# Patient Record
Sex: Male | Born: 1975
Health system: Southern US, Community
[De-identification: ages and names within clinical notes are randomized; demographics above are authoritative.]

## PROBLEM LIST (undated history)

## (undated) DIAGNOSIS — J302 Other seasonal allergic rhinitis: Secondary | ICD-10-CM

## (undated) DIAGNOSIS — K760 Fatty (change of) liver, not elsewhere classified: Secondary | ICD-10-CM

## (undated) DIAGNOSIS — G4733 Obstructive sleep apnea (adult) (pediatric): Principal | ICD-10-CM

## (undated) HISTORY — DX: Other seasonal allergic rhinitis: J30.2

## (undated) HISTORY — DX: Obstructive sleep apnea (adult) (pediatric): G47.33

## (undated) HISTORY — PX: OTHER SURGICAL HISTORY: SHX169

## (undated) HISTORY — DX: Fatty (change of) liver, not elsewhere classified: K76.0

---

## 2008-07-20 ENCOUNTER — Encounter: Admission: RE | Admit: 2008-07-20 | Discharge: 2008-07-20 | Payer: Self-pay | Admitting: Family Medicine

## 2009-06-03 IMAGING — US US ABDOMEN COMPLETE
1 series · 13 of 25 positions shown · non-contrast
Comparison: None

CLINICAL DATA: Elevated liver function tests.

ABDOMEN ULTRASOUND
TECHNIQUE: Complete abdominal ultrasound examination was performed
including evaluation of the liver, gallbladder, bile ducts,
pancreas, kidneys, spleen, IVC, and abdominal aorta.

[Series 1: us abdomen complete · 0.32mm/px · 13 of 54 slices shown]
[im 1/54]
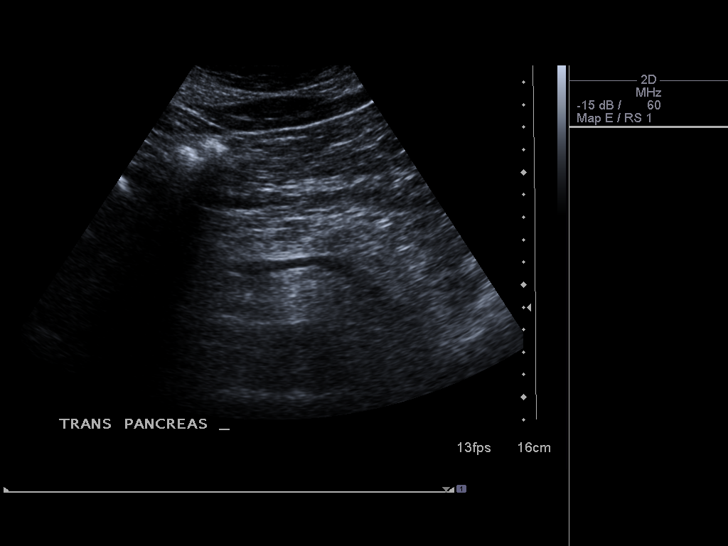
[im 5/54]
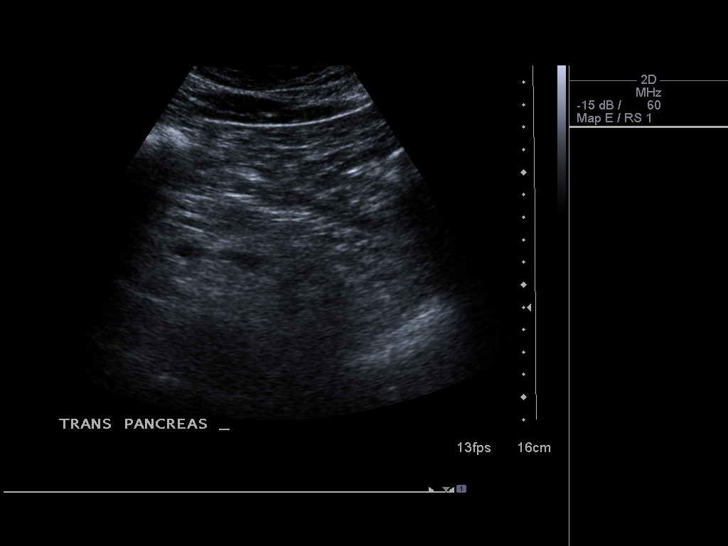
[im 9/54]
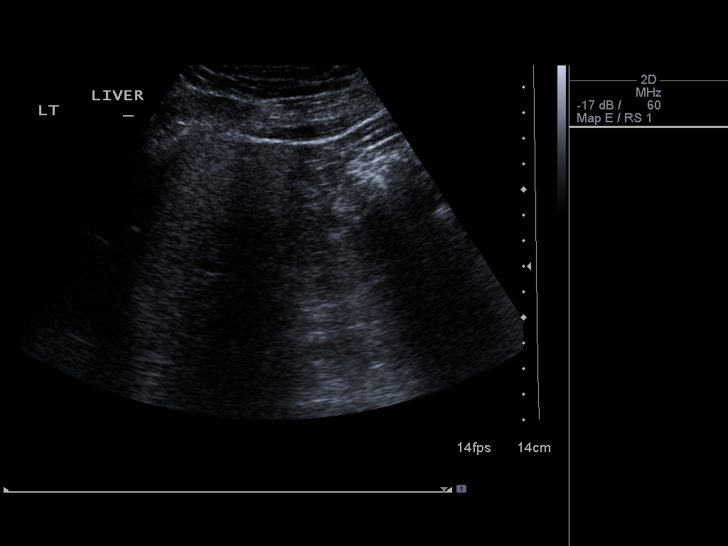
[im 14/54]
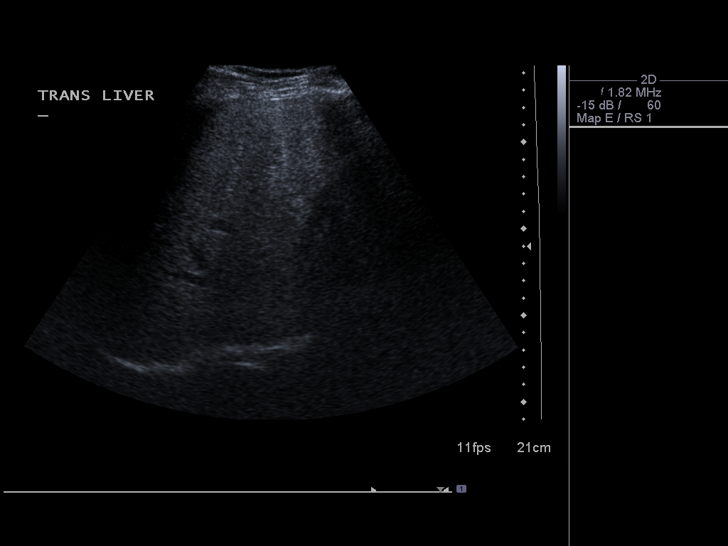
[im 18/54]
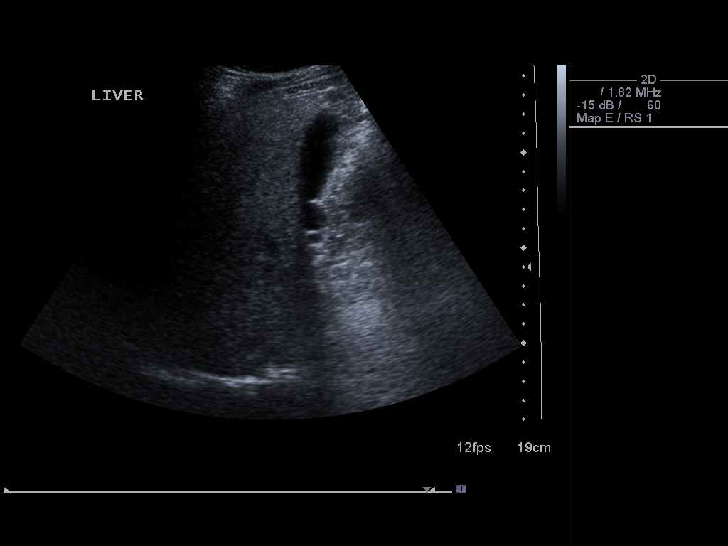
[im 23/54]
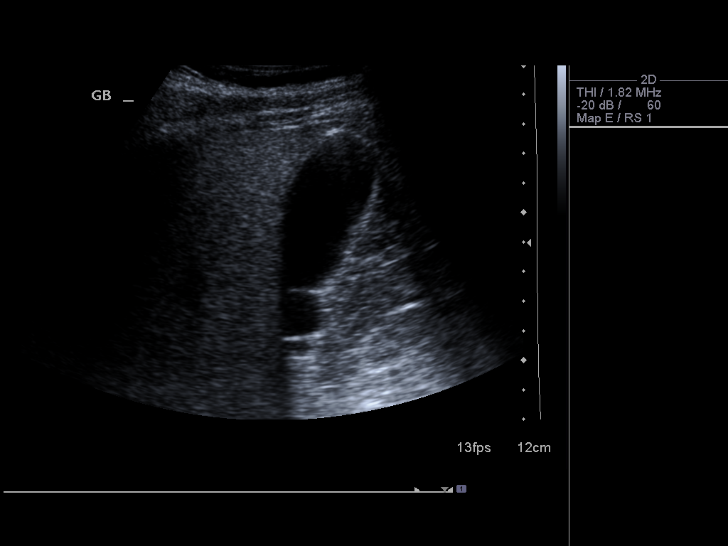
[im 27/54]
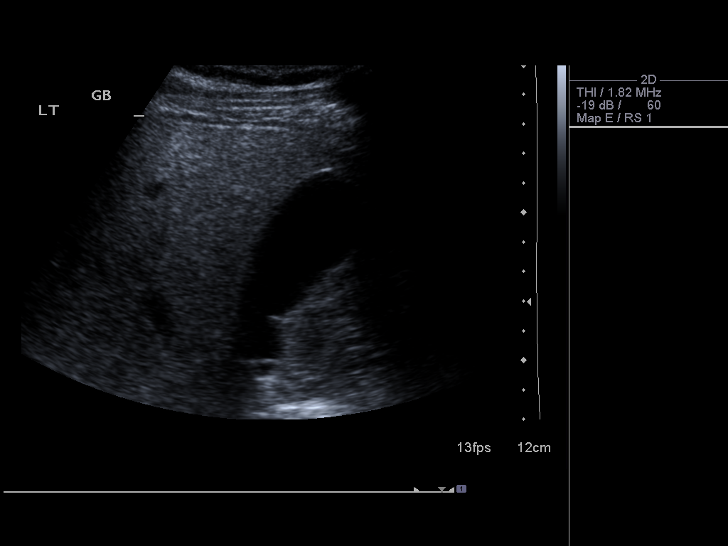
[im 31/54]
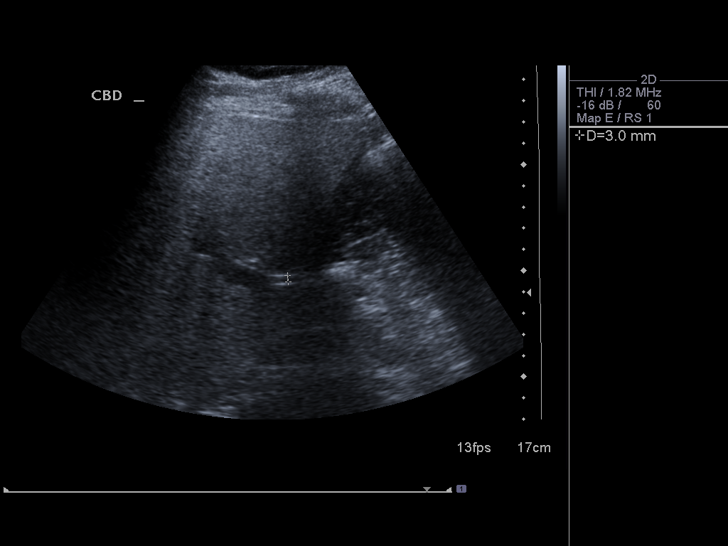
[im 36/54]
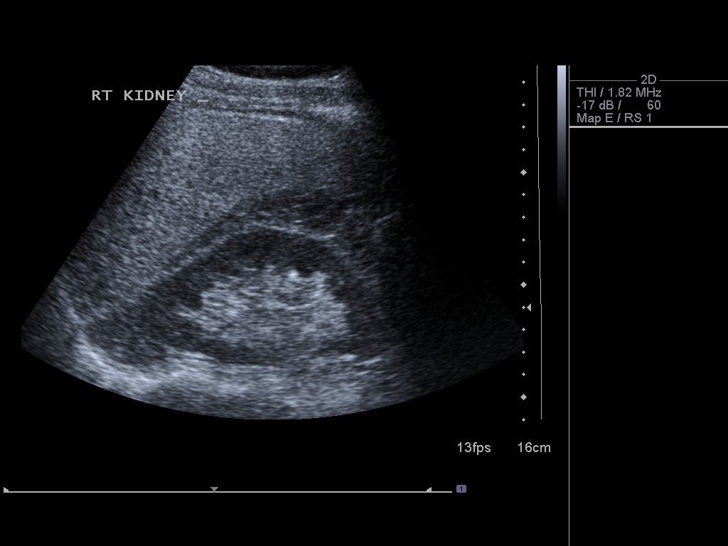
[im 40/54]
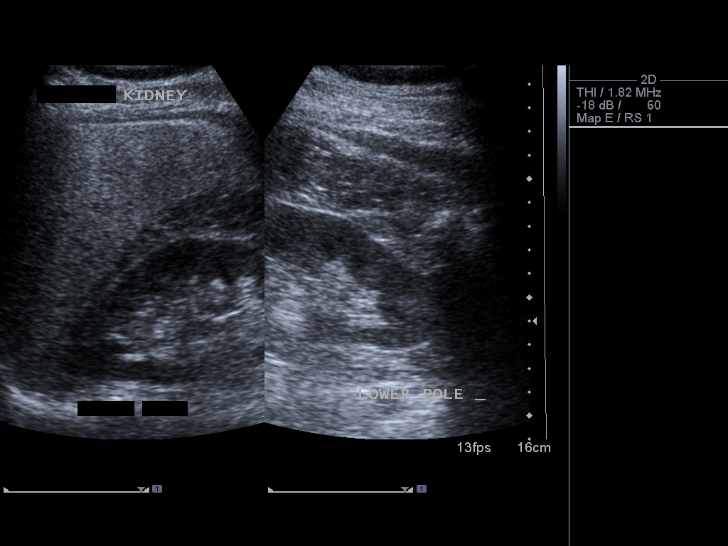
[im 45/54]
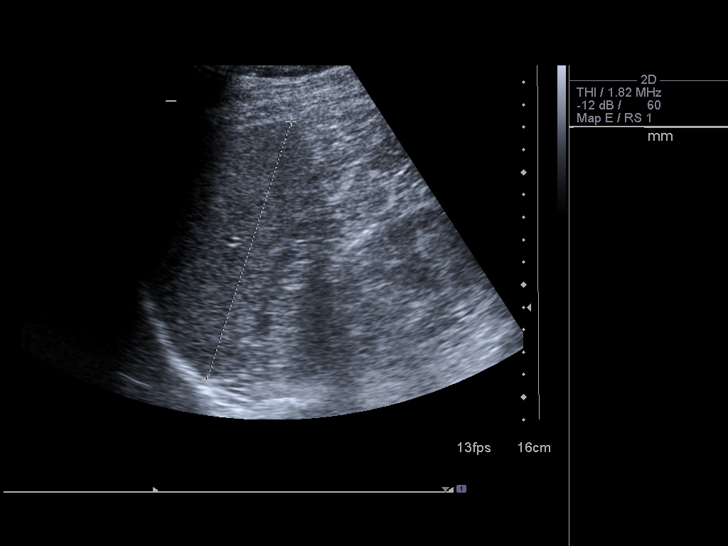
[im 49/54]
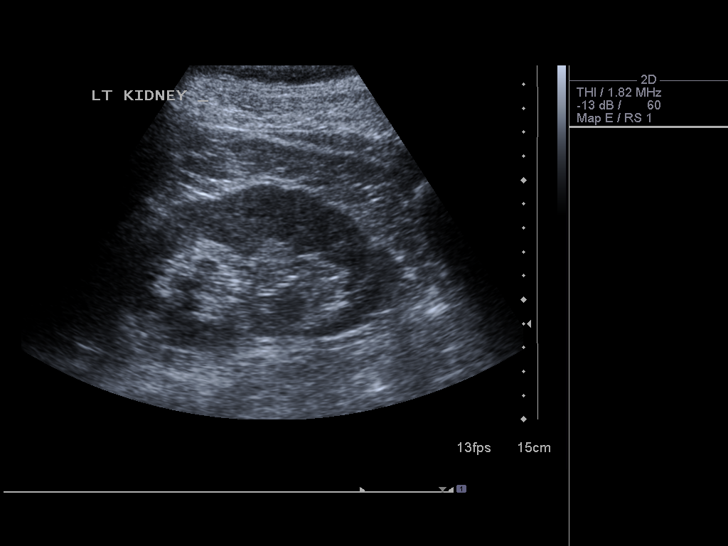
[im 54/54]
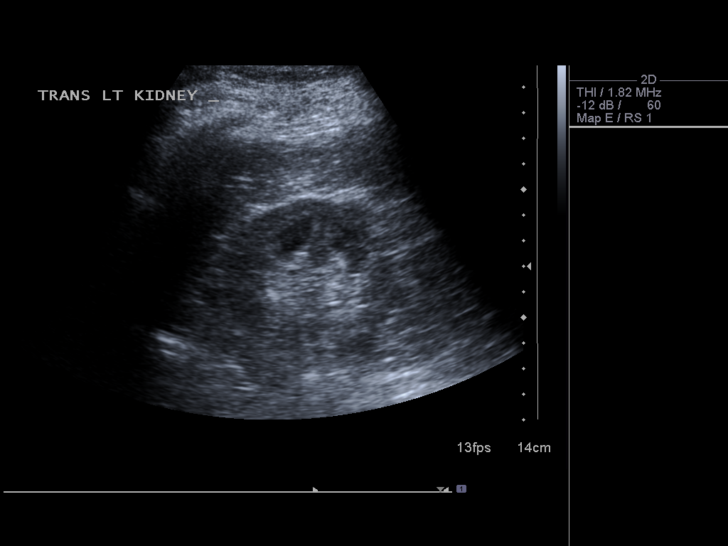

[13 of 25 positions shown; findings below may reference images not displayed]

FINDINGS: Gallbladder is sonographically normal without sludge or
gallstones with normal 1 mm wall thickness.  No dilated
intrahepatic or extrahepatic bile ducts are seen with common bile
duct measuring normally at 3 mm.  Liver demonstrates diffuse
increased echogenicity consistent with slight fatty change.  Liver
size is normal with no focal solid lesions.  Pancreas and inferior
vena cava are obscured due to the patient's body habitus and
overlying gastrointestinal gas.  Spleen (12.1 cm long), right
kidney (13 cm long), left kidney (12.5 cm long), and visualized
portion of abdominal aorta (proximal abdominal aorta obscured with
visualized maximum diameter at the mid segment measures 1.9 cm)
appear sonographically normal.  No free fluid noted.
IMPRESSION: 1.  Obscuration of proximal abdominal aorta, pancreas, and
infrahepatic inferior vena cava.
2.  Slight diffuse fatty infiltration liver.
3.  Otherwise, negative.

## 2015-07-03 ENCOUNTER — Telehealth: Payer: Self-pay | Admitting: Hematology

## 2015-07-03 NOTE — Telephone Encounter (Signed)
NEW PATIENT APPT-S/W PATIENT AND GAVE NP APPT FOR 10/11 @ 1:45 W/DR. KALE REFERRING DR. Modesto Charon DX-ELEVATED HGB   REFERRAL  INFORMATION SCANNED

## 2015-07-12 ENCOUNTER — Telehealth: Payer: Self-pay | Admitting: Hematology

## 2015-07-12 ENCOUNTER — Encounter: Payer: Self-pay | Admitting: Hematology

## 2015-07-12 ENCOUNTER — Ambulatory Visit (HOSPITAL_BASED_OUTPATIENT_CLINIC_OR_DEPARTMENT_OTHER): Payer: Commercial Managed Care - PPO | Admitting: Hematology

## 2015-07-12 ENCOUNTER — Other Ambulatory Visit (HOSPITAL_BASED_OUTPATIENT_CLINIC_OR_DEPARTMENT_OTHER): Payer: Commercial Managed Care - PPO

## 2015-07-12 VITALS — BP 126/78 | HR 60 | Temp 98.6°F | Resp 18 | Ht 74.0 in | Wt 278.2 lb

## 2015-07-12 DIAGNOSIS — K760 Fatty (change of) liver, not elsewhere classified: Secondary | ICD-10-CM | POA: Diagnosis not present

## 2015-07-12 DIAGNOSIS — J302 Other seasonal allergic rhinitis: Secondary | ICD-10-CM | POA: Diagnosis not present

## 2015-07-12 DIAGNOSIS — D582 Other hemoglobinopathies: Secondary | ICD-10-CM | POA: Diagnosis not present

## 2015-07-12 DIAGNOSIS — G4733 Obstructive sleep apnea (adult) (pediatric): Secondary | ICD-10-CM

## 2015-07-12 HISTORY — DX: Fatty (change of) liver, not elsewhere classified: K76.0

## 2015-07-12 HISTORY — DX: Obstructive sleep apnea (adult) (pediatric): G47.33

## 2015-07-12 LAB — COMPREHENSIVE METABOLIC PANEL (CC13)
ALBUMIN: 4.2 g/dL (ref 3.5–5.0)
ALK PHOS: 83 U/L (ref 40–150)
ALT: 67 U/L — ABNORMAL HIGH (ref 0–55)
AST: 40 U/L — ABNORMAL HIGH (ref 5–34)
Anion Gap: 6 mEq/L (ref 3–11)
BILIRUBIN TOTAL: 0.47 mg/dL (ref 0.20–1.20)
BUN: 12.3 mg/dL (ref 7.0–26.0)
CO2: 28 mEq/L (ref 22–29)
Calcium: 9.6 mg/dL (ref 8.4–10.4)
Chloride: 108 mEq/L (ref 98–109)
Creatinine: 1.1 mg/dL (ref 0.7–1.3)
EGFR: 85 mL/min/{1.73_m2} — AB (ref >90)
GLUCOSE: 91 mg/dL (ref 70–140)
Potassium: 4.3 mEq/L (ref 3.5–5.1)
SODIUM: 142 meq/L (ref 136–145)
TOTAL PROTEIN: 7.1 g/dL (ref 6.4–8.3)

## 2015-07-12 LAB — CBC & DIFF AND RETIC
BASO%: 0.6 % (ref 0.0–2.0)
Basophils Absolute: 0 10*3/uL (ref 0.0–0.1)
EOS%: 2.4 % (ref 0.0–7.0)
Eosinophils Absolute: 0.2 10*3/uL (ref 0.0–0.5)
HCT: 49.5 % (ref 38.4–49.9)
HGB: 17 g/dL (ref 13.0–17.1)
Immature Retic Fract: 2 % — ABNORMAL LOW (ref 3.00–10.60)
LYMPH%: 28.5 % (ref 14.0–49.0)
MCH: 29.3 pg (ref 27.2–33.4)
MCHC: 34.3 g/dL (ref 32.0–36.0)
MCV: 85.3 fL (ref 79.3–98.0)
MONO#: 0.5 10*3/uL (ref 0.1–0.9)
MONO%: 8.4 % (ref 0.0–14.0)
NEUT#: 3.8 10*3/uL (ref 1.5–6.5)
NEUT%: 60.1 % (ref 39.0–75.0)
Platelets: 167 10*3/uL (ref 140–400)
RBC: 5.8 10*6/uL (ref 4.20–5.82)
RDW: 12.7 % (ref 11.0–14.6)
Retic %: 1.25 % (ref 0.80–1.80)
Retic Ct Abs: 72.5 10*3/uL (ref 34.80–93.90)
WBC: 6.3 10*3/uL (ref 4.0–10.3)
lymph#: 1.8 10*3/uL (ref 0.9–3.3)

## 2015-07-12 NOTE — Telephone Encounter (Signed)
Gave adn printed avs °

## 2015-07-12 NOTE — Progress Notes (Signed)
Marland Kitchen    HEMATOLOGY/ONCOLOGY CONSULTATION NOTE  Date of Service: 07/12/2015  No care team member to display  CHIEF COMPLAINTS/PURPOSE OF CONSULTATION:  Elevated hemoglobin.   HISTORY OF PRESENTING ILLNESS:  Jeffrey Gonzalez is a wonderful 39 y.o. male who has been referred to [place] by Dr .Redmond Baseman, MD  for evaluation and management of elevated hemoglobin.  He has a history of obstructive sleep apnea currently using a CPAP machine, nonalcoholic fatty liver, obesity, history of seasonal allergies and history of early heart disease in his father at age 51yo. Patient has been a never smoker and denies using alcohol.  Patient had routine labs with his primary care physician Dr. Modesto Charon and was noted to have hemoglobin of 17.1 with a hematocrit of 51.4 on 06/20/2015. His WBC count was 6.6k and platelets of 181k. He has had similar hemoglobin and hematocrit on labs in June and April as well. He was referred for further evaluation of possible polycythemia.  Patient notes no history of blood clots or other venous thromboembolism. No family history of blood disorders.  No dizziness no lightheadedness no focal neurological deficits. No excessive new fatigue.  He was recently diagnosed with obstructive sleep apnea and has been using his CPAP regularly. He is working on weight loss through diet and exercise. Lifelong nonsmoker. No history of living at high altitudes.  MEDICAL HISTORY:  Past Medical History  Diagnosis Date  . Obstructive sleep apnea 07/12/2015  . Fatty liver disease, nonalcoholic 07/12/2015  . Seasonal allergies 07/13/2015    SURGICAL HISTORY: History reviewed. No pertinent past surgical history.  SOCIAL HISTORY: Social History   Social History  . Marital Status: Unknown    Spouse Name: N/A  . Number of Children: N/A  . Years of Education: N/A   Occupational History  . Not on file.   Social History Main Topics  . Smoking status: Never Smoker   . Smokeless tobacco:  Not on file  . Alcohol Use: No  . Drug Use: No  . Sexual Activity: No   Other Topics Concern  . Not on file   Social History Narrative    FAMILY HISTORY: Family History  Problem Relation Age of Onset  . Breast cancer Mother   . Coronary artery disease Father 80    ALLERGIES:  has no allergies on file.  MEDICATIONS:  No current outpatient prescriptions on file.   No current facility-administered medications for this visit.    REVIEW OF SYSTEMS:    10 Point review of Systems was done is negative except as noted above.  PHYSICAL EXAMINATION: ECOG PERFORMANCE STATUS: 1 - Symptomatic but completely ambulatory  . Filed Vitals:   07/12/15 1346  Height:  (1.88 m)  Weight: 278 lb 3.2 oz (126.191 kg)   Filed Weights   07/12/15 1346  Weight: 278 lb 3.2 oz (126.191 kg)   .Body mass index is 35.7 kg/(m^2).  GENERAL:alert, in no acute distress and comfortable SKIN: skin color, texture, turgor are normal, no rashes or significant lesions EYES: normal, conjunctiva are pink and non-injected, sclera clear OROPHARYNX:no exudate, no erythema and lips, buccal mucosa, and tongue normal  NECK: supple, no JVD, thyroid normal size, non-tender, without nodularity LYMPH:  no palpable lymphadenopathy in the cervical, axillary or inguinal LUNGS: clear to auscultation with normal respiratory effort HEART: regular rate & rhythm,  no murmurs and no lower extremity edema ABDOMEN: abdomen soft, obese non-tender, normoactive bowel sounds . No hepatosplenomegaly. Musculoskeletal: no cyanosis of digits and no clubbing  PSYCH: alert & oriented x 3 with fluent speech NEURO: no focal motor/sensory deficits  LABORATORY DATA:  I have reviewed the data as listed  . CBC Latest Ref Rng 07/12/2015  WBC 4.0 - 10.3 10e3/uL 6.3  Hemoglobin 13.0 - 17.1 g/dL 86.5  Hematocrit 78.4 - 49.9 % 49.5  Platelets 140 - 400 10e3/uL 167   . CBC    Component Value Date/Time   WBC 6.3 07/12/2015 1501    RBC 5.80 07/12/2015 1501   HGB 17.0 07/12/2015 1501   HCT 49.5 07/12/2015 1501   PLT 167 07/12/2015 1501   MCV 85.3 07/12/2015 1501   MCH 29.3 07/12/2015 1501   MCHC 34.3 07/12/2015 1501   RDW 12.7 07/12/2015 1501   LYMPHSABS 1.8 07/12/2015 1501   MONOABS 0.5 07/12/2015 1501   EOSABS 0.2 07/12/2015 1501   BASOSABS 0.0 07/12/2015 1501     . CMP Latest Ref Rng 07/12/2015  Glucose 70 - 140 mg/dl 91  BUN 7.0 - 69.6 mg/dL 29.5  Creatinine 0.7 - 1.3 mg/dL 1.1  Sodium 284 - 132 mEq/L 142  Potassium 3.5 - 5.1 mEq/L 4.3  CO2 22 - 29 mEq/L 28  Calcium 8.4 - 10.4 mg/dL 9.6  Total Protein 6.4 - 8.3 g/dL 7.1  Total Bilirubin 4.40 - 1.20 mg/dL 1.02  Alkaline Phos 40 - 150 U/L 83  AST 5 - 34 U/L 40(H)  ALT 0 - 55 U/L 67(H)     RADIOGRAPHIC STUDIES: I have personally reviewed the radiological images as listed and agreed with the findings in the report. No results found.  ASSESSMENT & PLAN:   39 year old patient male referred for  #1 Polycythemia. Patient's labs repeated here showed a hemoglobin of 17 and hematocrit of 49.5 which is at the very limits of the normal range. The absence of other cytosis (leucocytosis or thrombocytosis], absence of hepatosplenomegaly, increased iron levels with a ferritin of 239 and the patients younger age all make polycythemia vera less likely. His high normal  hemoglobin and hematocrit Is likely a secondary to his sleep apnea.  Plan  -Patient is somewhat anxious about this finding and will rule out completely the possibility of this being a bone marrow disorder.  -We shall get clonal markers Jak2 [id number] and Jak2 Exon 12 mutations to rule out polycythemia vera with 99% certainty.  -Optimization of his sleep apnea treatment, weight loss to achieve optimal body weight will likely be helpful in reducing his hemoglobin and hematocrit into the more normal territory. -No overt indication for aspirin use at this time . -No indication for therapeutic phlebotomy at  this time . -there aren't clear guidelines regarding ideal hematocrit targets in secondary polycythemia  which might be protective against VTE, MI/Stroke Continue follow-up with primary care physician to address her risk factors for atherosclerotic heart disease especially given his strong family history of early heart disease in his father.  #2 nonalcoholic fatty liver with slightly elevated transaminases -Likely related to obesity/metabolic syndrome. -Evaluation and management per primary care physician. -Counseled on the importance of appropriate diet and exercise.  I will call the patient with the results of his moderately genetic testing. It that's negative no further intervention from our side. Patient needs to work on weight loss and optimal management of sleep apnea.  We'll refer the patient back to his primary care physician.  I appreciate the opportunity to help take care of this patient.  All of the patients questions were answered to his  apparent satisfaction. The patient knows  to call the clinic with any problems, questions or concerns.  I spent 30 minutes counseling the patient face to face. The total time spent in the appointment was 45 minutes and more than 50% was on counseling and direct patient cares.    Wyvonnia Lora MD MS AAHIVMS [facility] Hematology/Oncology Physician [facility]  (Office):       [phone] (Work cell):  [phone] (Fax):           [phone]  07/12/2015 2:07 PM

## 2015-07-13 ENCOUNTER — Encounter: Payer: Self-pay | Admitting: Hematology

## 2015-07-13 DIAGNOSIS — J302 Other seasonal allergic rhinitis: Secondary | ICD-10-CM

## 2015-07-13 HISTORY — DX: Other seasonal allergic rhinitis: J30.2

## 2017-06-05 DIAGNOSIS — Z630 Problems in relationship with spouse or partner: Secondary | ICD-10-CM | POA: Diagnosis not present

## 2019-02-21 DIAGNOSIS — S46211A Strain of muscle, fascia and tendon of other parts of biceps, right arm, initial encounter: Secondary | ICD-10-CM | POA: Diagnosis not present

## 2019-02-21 DIAGNOSIS — S46209A Unspecified injury of muscle, fascia and tendon of other parts of biceps, unspecified arm, initial encounter: Secondary | ICD-10-CM | POA: Diagnosis not present

## 2019-05-30 ENCOUNTER — Emergency Department (HOSPITAL_BASED_OUTPATIENT_CLINIC_OR_DEPARTMENT_OTHER): Payer: Commercial Managed Care - PPO

## 2019-05-30 ENCOUNTER — Other Ambulatory Visit: Payer: Self-pay

## 2019-05-30 ENCOUNTER — Encounter (HOSPITAL_BASED_OUTPATIENT_CLINIC_OR_DEPARTMENT_OTHER): Payer: Self-pay | Admitting: Emergency Medicine

## 2019-05-30 ENCOUNTER — Emergency Department (HOSPITAL_BASED_OUTPATIENT_CLINIC_OR_DEPARTMENT_OTHER)
Admission: EM | Admit: 2019-05-30 | Discharge: 2019-05-30 | Disposition: A | Discharge status: Home / Self Care | Payer: Commercial Managed Care - PPO | Attending: Emergency Medicine | Admitting: Emergency Medicine

## 2019-05-30 DIAGNOSIS — Y999 Unspecified external cause status: Secondary | ICD-10-CM | POA: Diagnosis not present

## 2019-05-30 DIAGNOSIS — Z79899 Other long term (current) drug therapy: Secondary | ICD-10-CM | POA: Diagnosis not present

## 2019-05-30 DIAGNOSIS — S82831A Other fracture of upper and lower end of right fibula, initial encounter for closed fracture: Secondary | ICD-10-CM | POA: Diagnosis not present

## 2019-05-30 DIAGNOSIS — Y929 Unspecified place or not applicable: Secondary | ICD-10-CM | POA: Insufficient documentation

## 2019-05-30 DIAGNOSIS — Y9301 Activity, walking, marching and hiking: Secondary | ICD-10-CM | POA: Insufficient documentation

## 2019-05-30 DIAGNOSIS — S82891A Other fracture of right lower leg, initial encounter for closed fracture: Secondary | ICD-10-CM

## 2019-05-30 DIAGNOSIS — S99911A Unspecified injury of right ankle, initial encounter: Secondary | ICD-10-CM | POA: Diagnosis present

## 2019-05-30 DIAGNOSIS — W109XXA Fall (on) (from) unspecified stairs and steps, initial encounter: Secondary | ICD-10-CM | POA: Insufficient documentation

## 2019-05-30 MED ORDER — FENTANYL CITRATE (PF) 100 MCG/2ML IJ SOLN
50.0000 ug | Freq: Once | INTRAMUSCULAR | Status: AC
  Administered 2019-05-30: 50 ug via INTRAVENOUS

## 2019-05-30 MED ORDER — FENTANYL CITRATE (PF) 100 MCG/2ML IJ SOLN
INTRAMUSCULAR | Status: AC
  Filled 2019-05-30: qty 2

## 2019-05-30 MED ORDER — OXYCODONE-ACETAMINOPHEN 5-325 MG PO TABS: 1.0000 | ORAL_TABLET | ORAL | 0 refills | Status: AC | PRN

## 2019-05-30 NOTE — ED Notes (Signed)
ED Provider at bedside. 

## 2019-05-30 NOTE — Discharge Instructions (Signed)
Your imaging today revealed an ankle fracture.  We were able to provide a splint and speak with orthopedics team.  He request to call tomorrow to schedule appointment in the next few days.  They may need to surgery.  Please use the pain medicine and crutches to help with your discomfort.  If any symptoms change or worsen, please return to the nearest emergency department.

## 2019-05-30 NOTE — ED Notes (Signed)
Patient transported to X-ray 

## 2019-05-30 NOTE — ED Provider Notes (Signed)
[facility] EMERGENCY DEPARTMENT Provider Note   CSN: [id number] Arrival date & time: 05/30/19  1716     History   Chief Complaint Chief Complaint  Patient presents with  . Ankle Injury    HPI Clementeen GrahamBryan Kayes is a 43 y.o. male.     The history is provided by the patient and medical records. No language interpreter was used.  Ankle Pain Location:  Ankle Time since incident:  30 minutes Injury: yes   Mechanism of injury: fall   Fall:    Fall occurred:  Down stairs Ankle location:  R ankle Pain details:    Quality:  Sharp   Radiates to:  Does not radiate   Severity:  Severe   Onset quality:  Sudden   Timing:  Constant Chronicity:  New Foreign body present:  No foreign bodies Tetanus status:  Unknown Prior injury to area:  No Relieved by:  Nothing Worsened by:  Bearing weight Associated symptoms: no back pain, no fatigue, no fever and no neck pain   Risk factors: no concern for non-accidental trauma and no frequent fractures     Past Medical History:  Diagnosis Date  . Fatty liver disease, nonalcoholic 07/12/2015  . Obstructive sleep apnea 07/12/2015  . Seasonal allergies 07/13/2015    Patient Active Problem List   Diagnosis Date Noted  . Seasonal allergies 07/13/2015  . Obstructive sleep apnea 07/12/2015  . Fatty liver disease, nonalcoholic 07/12/2015  . Elevated hemoglobin (HCC) 07/12/2015    Past Surgical History:  Procedure Laterality Date  . bicep surgery          Home Medications    Prior to Admission medications   Medication Sig Start Date End Date Taking? Authorizing Provider  Cetirizine HCl (ZYRTEC ALLERGY PO) Take by mouth.    [provider]    Family History Family History  Problem Relation Age of Onset  . Breast cancer Mother   . Coronary artery disease Father 833    Social History Social History   Tobacco Use  . Smoking status: Never Smoker  . Smokeless tobacco: Never Used  Substance Use Topics  . Alcohol  use: No  . Drug use: No     Allergies   Patient has no known allergies.   Review of Systems Review of Systems  Constitutional: Negative for chills, diaphoresis, fatigue and fever.  HENT: Negative for congestion.   Eyes: Negative for visual disturbance.  Respiratory: Negative for cough, chest tightness, shortness of breath and wheezing.   Cardiovascular: Negative for chest pain.  Gastrointestinal: Negative for abdominal pain, constipation, diarrhea, nausea and vomiting.  Genitourinary: Negative for flank pain.  Musculoskeletal: Negative for back pain, neck pain and neck stiffness.  Skin: Positive for wound (abrasions). Negative for rash.  Neurological: Negative for light-headedness, numbness and headaches.  Psychiatric/Behavioral: Negative for agitation.     Physical Exam Updated Vital Signs BP 113/84 (BP Location: Left Arm)   Pulse 76   Temp 98.3 F (36.8 C) (Oral)   Resp 14   Wt 113.4 kg   SpO2 97%   BMI 32.10 kg/m   Physical Exam Vitals signs reviewed.  Constitutional:      General: He is not in acute distress.    Appearance: He is not ill-appearing, toxic-appearing or diaphoretic.  HENT:     Head: Normocephalic and atraumatic.     Mouth/Throat:     Mouth: Mucous membranes are moist.  Eyes:     Pupils: Pupils are equal, round, and reactive  to light.  Neck:     Musculoskeletal: No muscular tenderness.  Cardiovascular:     Rate and Rhythm: Normal rate.     Pulses: Normal pulses.     Heart sounds: No murmur.  Chest:     Chest wall: No tenderness.  Abdominal:     General: Abdomen is flat.     Tenderness: There is no abdominal tenderness.  Musculoskeletal:        General: Swelling, tenderness and signs of injury present. No deformity.     Right ankle: He exhibits swelling and ecchymosis. He exhibits no laceration and normal pulse. Tenderness. Lateral malleolus and medial malleolus tenderness found.     Right lower leg: No edema.     Left lower leg: No  edema.       Feet:     Comments: Normal toe strength, sensation, and DP/PT pulse in right foot.  Abrasions present in shin but no laceration overlying the injury.  Diffuse tenderness and swelling.  Skin:    Capillary Refill: Capillary refill takes less than 2 seconds.     Coloration: Skin is not pale.     Findings: No erythema or rash.  Neurological:     General: No focal deficit present.     Mental Status: He is alert and oriented to person, place, and time.     Sensory: No sensory deficit.     Motor: No weakness.  Psychiatric:        Mood and Affect: Mood normal.      ED Treatments / Results  Labs (all labs ordered are listed, but only abnormal results are displayed) Labs Reviewed - No data to display  EKG None  Radiology Dg Ankle Complete Right  Result Date: 05/30/2019 CLINICAL DATA:  Right ankle pain EXAM: RIGHT ANKLE - COMPLETE 3+ VIEW COMPARISON:  None. FINDINGS: There is an acute mildly displaced fracture of the distal fibula. There is an acute displaced fracture of the medial malleolus. There appears to be some widening of the syndesmosis and medial clear space. There is no convincing evidence for a fracture involving the posterior malleolus. There is extensive surrounding soft tissue swelling. IMPRESSION: 1. Acute fractures of the distal fibula and medial malleolus. 2. Widening of the medial clear space. 3. Surrounding soft tissue swelling. Electronically Signed   By: Constance Holster M.D.   On: 05/30/2019 18:01    Procedures Procedures (including critical care time)  Medications Ordered in ED Medications  fentaNYL (SUBLIMAZE) injection 50 mcg (50 mcg Intravenous Given 05/30/19 1926)     Initial Impression / Assessment and Plan / ED Course  I have reviewed the triage vital signs and the nursing notes.  Pertinent labs & imaging results that were available during my care of the patient were reviewed by me and considered in my medical decision making (see chart for  details).        Tryone Kille is a 43 y.o. male with a past medical history significant for sleep apnea and fatty liver disease who presents with right ankle injury.  Patient reports that he was going downstairs in the rain when he slipped and fell down the stair causing him to twist his right ankle.  He had deformity and severe pain.  EMS brought patient in with a splint in place and receiving fentanyl.  Patient reports of ear pain but denies any other complaints.  He denies any numbness, tingling, or weakness in the foot.  He denies any head injury, chest pain, neck  pain, or back pain.  Denies abdominal injury.  Denies other locations of injury.  Small abrasion on his bilateral shins right worse than left.  On exam, patient has palpable DP and PT pulse.  Normal capillary refill and sensation in the foot.  Normal strength of the toes.  No knee tenderness or hip tenderness.  Lungs clear chest and abdomen nontender.  No focal neurologic deficits.  X-ray was obtained showing by malleoli are fracture in the right ankle.  Fracture is closed with no lacerations.  Orthopedics was called who recommended splinting and follow-up in clinic.  Patient placed into a posterior splint with stirrups as well as given crutches.  Given prescription for pain medicine and instructions for orthopedic follow-up.  Patient had good sensation and capillary refill in his toes after splinting.  Patient will follow-up with orthopedics for further likely surgical management.  He understood return precautions and follow-up instructions.  Patient discharged in good condition.   Final Clinical Impressions(s) / ED Diagnoses   Final diagnoses:  Closed fracture of right ankle, initial encounter    ED Discharge Orders         Ordered    oxyCODONE-acetaminophen (PERCOCET/ROXICET) 5-325 MG tablet  Every 4 hours PRN     05/30/19 2030          Clinical Impression: 1. Closed fracture of right ankle, initial encounter      Disposition: Discharge  Condition: Good  I have discussed the results, Dx and Tx plan with the pt(& family if present). He/she/they expressed understanding and agree(s) with the plan. Discharge instructions discussed at great length. Strict return precautions discussed and pt &/or family have verbalized understanding of the instructions. No further questions at time of discharge.    Discharge Medication List as of 05/30/2019  8:31 PM    START taking these medications   Details  oxyCODONE-acetaminophen (PERCOCET/ROXICET) 5-325 MG tablet Take 1 tablet by mouth every 4 (four) hours as needed for severe pain., Starting Mon 05/30/2019, Normal        Follow Up: Ollen GrossAluisio, Frank, MD [address] [place] [id number] [phone]  Call in 1 day   [facility] [address] [id number] [id number] [address] [place] [id number] [phone]       , Canary Brimhristopher J, MD 05/31/19 0120

## 2019-05-30 NOTE — ED Notes (Signed)
Pt updated with plan of care and delay. No concerns at this time. Pt denies pain

## 2019-05-30 NOTE — ED Triage Notes (Signed)
Fall, EMS transport. Right ankle deformity. IV. Fentanyl 50MCG given by EMS PTA. Splint in place. No LOC.

## 2020-04-12 IMAGING — DX RIGHT ANKLE - COMPLETE 3+ VIEW
3 series · 3 of 3 positions shown · non-contrast
Comparison: None.

CLINICAL DATA: Right ankle pain

EXAM:
RIGHT ANKLE - COMPLETE 3+ VIEW

[ankle ap]
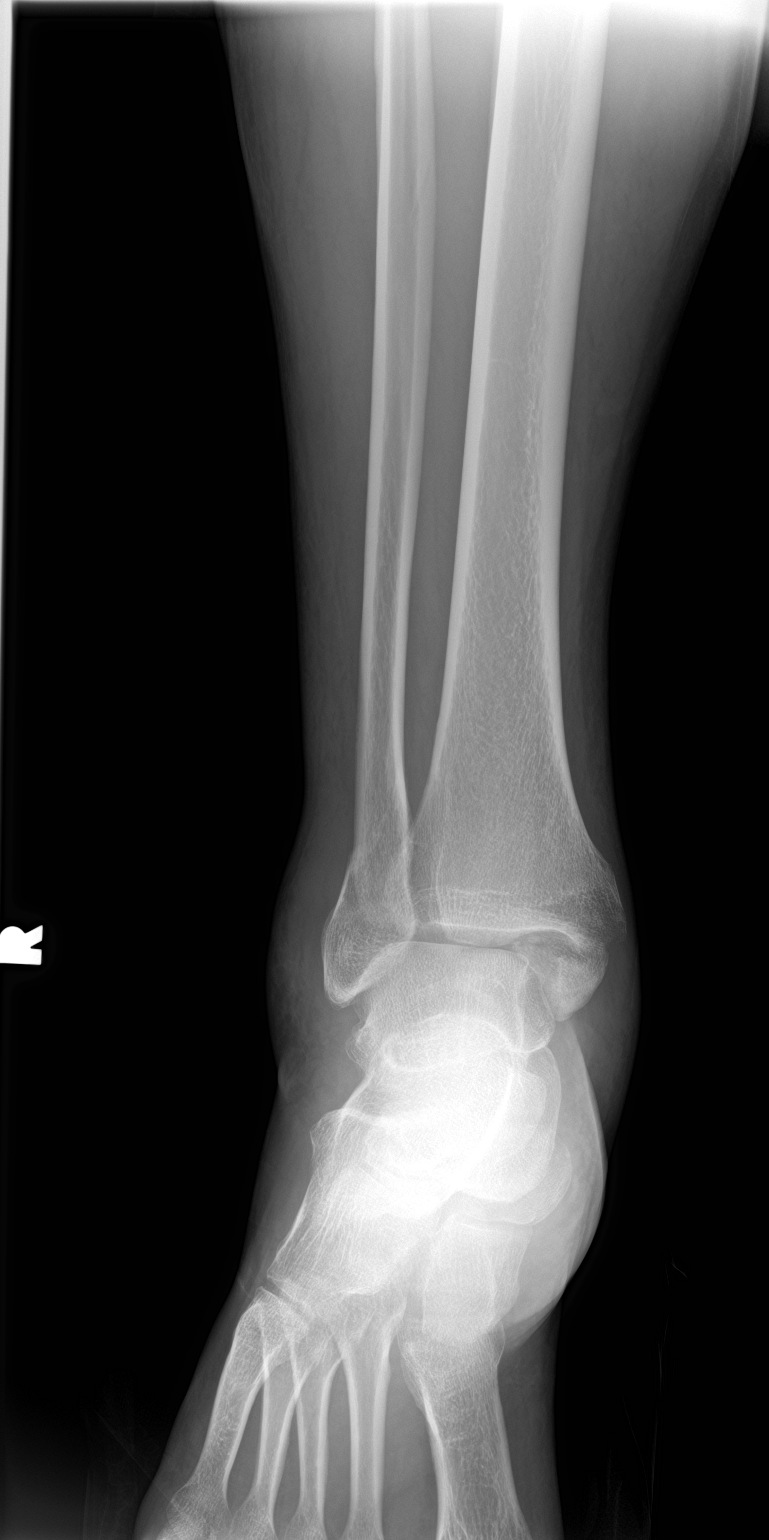

[ankle obl]
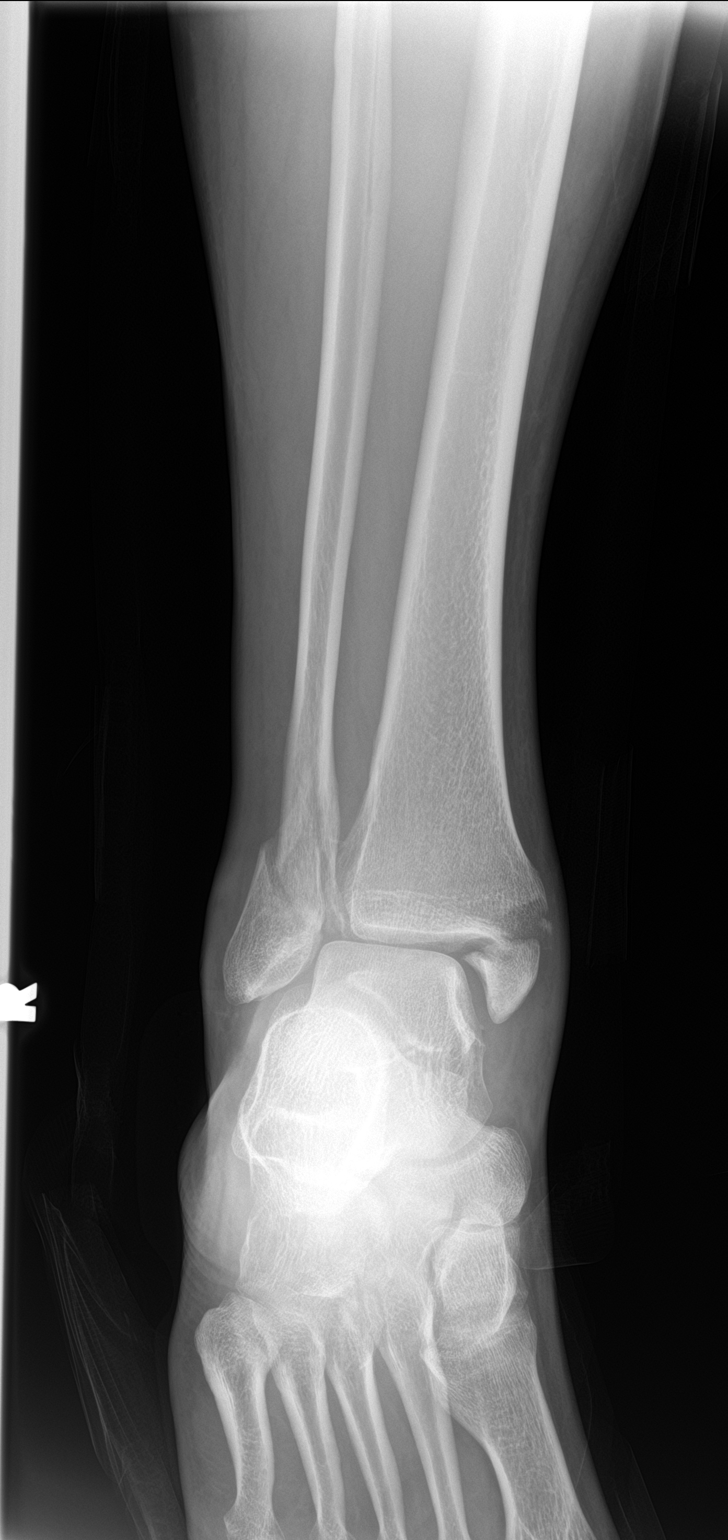

[ankle lat]
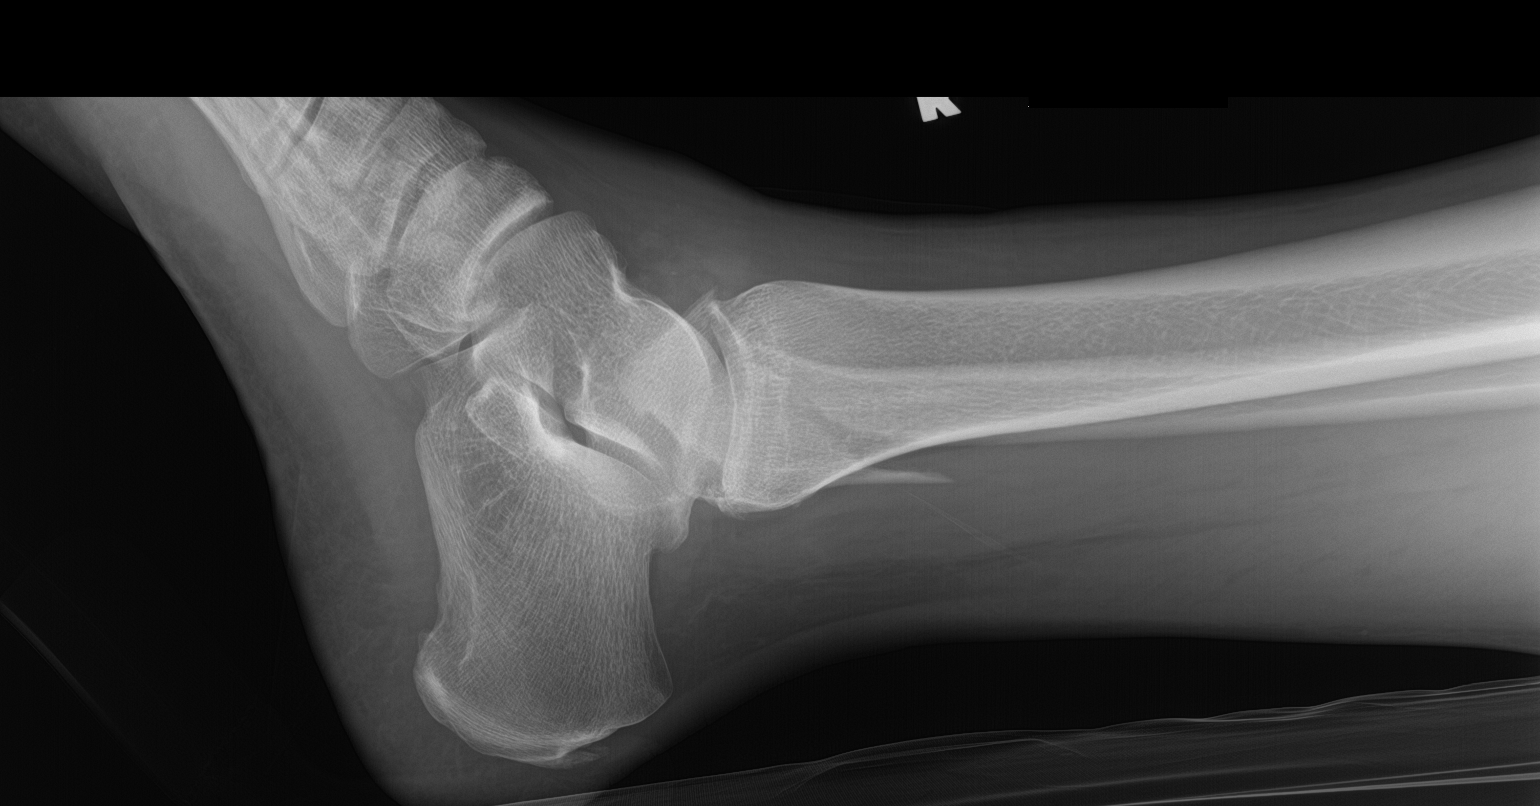

[3 of 3 positions shown; findings below may reference images not displayed]

FINDINGS: There is an acute mildly displaced fracture of the distal fibula.
There is an acute displaced fracture of the medial malleolus. There
appears to be some widening of the syndesmosis and medial clear
space. There is no convincing evidence for a fracture involving the
posterior malleolus. There is extensive surrounding soft tissue
swelling.
IMPRESSION: 1. Acute fractures of the distal fibula and medial malleolus.
2. Widening of the medial clear space.
3. Surrounding soft tissue swelling.
